# Patient Record
Sex: Female | Born: 1945 | Race: White | Hispanic: No
Health system: Midwestern US, Academic
[De-identification: ages and names within clinical notes are randomized; demographics above are authoritative.]

---

## 2018-03-05 ENCOUNTER — Ambulatory Visit: Admit: 2018-03-05 | Discharge: 2018-03-06 | Payer: MEDICARE

## 2018-03-05 ENCOUNTER — Encounter: Admit: 2018-03-05 | Discharge: 2018-03-05 | Payer: MEDICARE

## 2018-03-05 DIAGNOSIS — R42 Dizziness and giddiness: Principal | ICD-10-CM

## 2020-07-13 ENCOUNTER — Encounter: Admit: 2020-07-13 | Discharge: 2020-07-13 | Payer: MEDICARE

## 2020-07-14 ENCOUNTER — Encounter: Admit: 2020-07-14 | Discharge: 2020-07-14 | Payer: MEDICARE

## 2020-07-14 ENCOUNTER — Ambulatory Visit: Admit: 2020-07-14 | Discharge: 2020-07-14 | Payer: MEDICARE

## 2020-07-14 DIAGNOSIS — R9431 Abnormal electrocardiogram [ECG] [EKG]: Secondary | ICD-10-CM

## 2020-07-14 DIAGNOSIS — R918 Other nonspecific abnormal finding of lung field: Secondary | ICD-10-CM

## 2020-07-14 DIAGNOSIS — J441 Chronic obstructive pulmonary disease with (acute) exacerbation: Secondary | ICD-10-CM

## 2020-07-20 ENCOUNTER — Encounter: Admit: 2020-07-20 | Discharge: 2020-07-20 | Payer: MEDICARE

## 2020-07-21 ENCOUNTER — Encounter: Admit: 2020-07-21 | Discharge: 2020-07-21 | Payer: MEDICARE

## 2020-07-21 DIAGNOSIS — R9431 Abnormal electrocardiogram [ECG] [EKG]: Secondary | ICD-10-CM

## 2020-07-21 DIAGNOSIS — J449 Chronic obstructive pulmonary disease, unspecified: Secondary | ICD-10-CM

## 2020-07-21 DIAGNOSIS — E785 Hyperlipidemia, unspecified: Secondary | ICD-10-CM

## 2020-07-21 DIAGNOSIS — R030 Elevated blood-pressure reading, without diagnosis of hypertension: Secondary | ICD-10-CM

## 2020-07-21 DIAGNOSIS — R0600 Dyspnea, unspecified: Secondary | ICD-10-CM

## 2020-07-21 MED ORDER — ROSUVASTATIN 10 MG PO TAB
10 mg | ORAL_TABLET | Freq: Every day | ORAL | 1 refills | 90.00000 days | Status: AC
Start: 2020-07-21 — End: ?

## 2020-07-21 NOTE — Patient Instructions
Start Crestor 10mg  at bedtime  Stress tess   Recheck labs in 3 mo    [facility] Nuclear Stress Test Instructions    PLEASE REPORT TO:    ____[facility] [id number], [phone], [place], [place]) - [phone]   ____[facility] ([address], [address], [place], [place]) - [phone]    ____[facility] ([address] [place], [place]) - [phone]    ____[facility] ([address], [place], [place]) - [phone]   ____[facility] ([address], [place], [place] ) - [phone]     [facility] Main Phone Number: [phone]     Date of Test  ____________  at ____________  for ________________________    Are you able to raise your arm up by your head for about 20 minutes? yes  Can you lie on your back for approximately 20 minutes with minimal movement? yes     The Thallium evaluation has two parts -- two nuclear scans.   The first scan is done in the morning and the second three to four hours later.     Wear comfortable clothing. Bring or wear comfortable walking shoes.     It is recommended not to hold infants for 2 to 3 days after the test.   Please let the nuclear technologists know if you plan on flying after the test.     NO DECAF OR CAFFEINE 24 HOURS PRIOR TO TEST. Examples: coffee, tea, decaf coffee or tea, cola, chocolate.     DO NOT EAT OR DRINK THE MORNING OF YOUR TEST unless otherwise instructed. (You may have a couple sips of water.) If you are a diabetic, if insulin dependent: please take one third of your insulin with a light breakfast (two pieces of dry toast and a small juice). Bring insulin and medication with you to the test.     ___ TAKE MORNING MEDICATIONS WITH A COUPLE SIPS OF WATER PRIOR TO TEST.     Hold all vitamins and supplements on the morning of your test.     HOLD THE FOLLOWING MEDICATIONS AS INDICATED BELOW:           WHAT TO DO BETWEEN THE FIRST TWO THALLIUM SCANS:  1. No strenuous exercise should be performed during this time.  2. A light lunch is permissible. The technologist will give you a list of appropriate foods.  3. Please return 15 minutes prior to the schedule of your second scan. Our nuclear technologist will tell you exactly what time to return.  4. Please do not use tobacco products in between scans.  5. After the first scan is completed, you may resume usual medications.     TEST FINDINGS:  You will receive the results of the test within 7 business days of its completion by telephone, unless arranged differently at the time of the procedure.  If you have any questions concerning your thallium test or if you do not hear from your [facility] physician/or nurse within 7 business days, please call the appropriate office checked above.

## 2020-08-06 ENCOUNTER — Encounter: Admit: 2020-08-06 | Discharge: 2020-08-06 | Payer: MEDICARE

## 2020-08-06 ENCOUNTER — Ambulatory Visit: Admit: 2020-08-06 | Discharge: 2020-08-06 | Payer: MEDICARE

## 2020-08-06 DIAGNOSIS — R9431 Abnormal electrocardiogram [ECG] [EKG]: Secondary | ICD-10-CM

## 2020-08-06 DIAGNOSIS — R030 Elevated blood-pressure reading, without diagnosis of hypertension: Secondary | ICD-10-CM

## 2020-08-06 DIAGNOSIS — R0600 Dyspnea, unspecified: Secondary | ICD-10-CM

## 2020-08-13 ENCOUNTER — Encounter: Admit: 2020-08-13 | Discharge: 2020-08-13 | Payer: MEDICARE

## 2020-08-13 NOTE — Telephone Encounter
Called patient and discussed test results and recommendations. Patient is agreeable to follow up on 09/17/20 at 11:30 in [place].

## 2020-08-13 NOTE — Telephone Encounter
-----   Message from Altamease Oiler, MD sent at 08/13/2020  2:42 PM CDT -----  Pearletha Alfred just sent a message about getting a CTA on this patient to look at her coronary arteries.  I actually prefer to see her back in the office to discuss further testing so if you would mind setting up an appointment with me sometime in the next 6 to 8 weeks so we can talk I think that would be best.  Hold off on CTA for now.Thanks!

## 2020-09-18 ENCOUNTER — Encounter: Admit: 2020-09-18 | Discharge: 2020-09-18 | Payer: MEDICARE

## 2020-10-28 ENCOUNTER — Encounter: Admit: 2020-10-28 | Discharge: 2020-10-28 | Payer: MEDICARE

## 2021-01-05 ENCOUNTER — Encounter: Admit: 2021-01-05 | Discharge: 2021-01-05 | Payer: MEDICARE

## 2021-01-05 NOTE — Progress Notes
Contacted patient by phone on 01/05/2021 regarding lab order for Lipid Panel ordered per [facility] that has not been completed. Patient stated that she does not have transportation and is unable to get lab drawn before appointment date. Patient's daughter plans to bring patient to her appointment with Dr. Sandria Manly in [place], [place] on 01/12/2021. Patient will pick up lab order at appointment and have drawn in the [facility] same day.

## 2021-01-12 ENCOUNTER — Encounter: Admit: 2021-01-12 | Discharge: 2021-01-12 | Payer: MEDICARE

## 2021-01-12 DIAGNOSIS — E782 Mixed hyperlipidemia: Secondary | ICD-10-CM

## 2021-01-12 DIAGNOSIS — R030 Elevated blood-pressure reading, without diagnosis of hypertension: Secondary | ICD-10-CM

## 2021-01-12 DIAGNOSIS — R9431 Abnormal electrocardiogram [ECG] [EKG]: Secondary | ICD-10-CM

## 2021-01-12 DIAGNOSIS — J449 Chronic obstructive pulmonary disease, unspecified: Secondary | ICD-10-CM

## 2021-01-12 MED ORDER — ROSUVASTATIN 10 MG PO TAB
10 mg | ORAL_TABLET | Freq: Every day | ORAL | 3 refills | 90.00000 days | Status: AC
Start: 2021-01-12 — End: ?

## 2021-01-12 NOTE — Patient Instructions
Start crestor.  Recheck labs in 3 months  Follow-up in 6 months    Follow up as directed.  Call sooner if issues.  Call the [place] nursing line at [phone].  Leave a detailed message for the nurse in [place] [facility] with how we can assist you and we will call you back.

## 2021-07-02 ENCOUNTER — Encounter: Admit: 2021-07-02 | Discharge: 2021-07-02 | Payer: MEDICARE

## 2021-07-20 ENCOUNTER — Encounter: Admit: 2021-07-20 | Discharge: 2021-07-20 | Payer: MEDICARE

## 2021-07-20 DIAGNOSIS — E782 Mixed hyperlipidemia: Secondary | ICD-10-CM

## 2021-07-20 DIAGNOSIS — M79606 Pain in leg, unspecified: Secondary | ICD-10-CM

## 2021-07-20 DIAGNOSIS — R9431 Abnormal electrocardiogram [ECG] [EKG]: Secondary | ICD-10-CM

## 2021-07-20 DIAGNOSIS — R600 Localized edema: Secondary | ICD-10-CM

## 2021-07-20 DIAGNOSIS — Z136 Encounter for screening for cardiovascular disorders: Secondary | ICD-10-CM

## 2021-07-20 DIAGNOSIS — I739 Peripheral vascular disease, unspecified: Secondary | ICD-10-CM

## 2021-07-20 DIAGNOSIS — J449 Chronic obstructive pulmonary disease, unspecified: Secondary | ICD-10-CM

## 2021-07-20 DIAGNOSIS — J439 Emphysema, unspecified: Secondary | ICD-10-CM

## 2021-07-20 DIAGNOSIS — R0989 Other specified symptoms and signs involving the circulatory and respiratory systems: Secondary | ICD-10-CM

## 2021-07-20 NOTE — Patient Instructions
Echo, Carotids, Aorta, lower ext ultrasound    Labs; FLP    Follow up in 6 mo    Follow up as directed.  Call sooner if issues.  Call the [place] nursing line at [phone].  Leave a detailed message for the nurse in [place] [facility] with how we can assist you and we will call you back.

## 2021-07-29 ENCOUNTER — Encounter: Admit: 2021-07-29 | Discharge: 2021-07-29 | Payer: MEDICARE

## 2021-07-29 DIAGNOSIS — R0989 Other specified symptoms and signs involving the circulatory and respiratory systems: Secondary | ICD-10-CM

## 2021-07-29 DIAGNOSIS — Z136 Encounter for screening for cardiovascular disorders: Secondary | ICD-10-CM

## 2021-07-29 DIAGNOSIS — R9431 Abnormal electrocardiogram [ECG] [EKG]: Secondary | ICD-10-CM

## 2021-07-29 DIAGNOSIS — I739 Peripheral vascular disease, unspecified: Secondary | ICD-10-CM

## 2021-07-29 DIAGNOSIS — J439 Emphysema, unspecified: Secondary | ICD-10-CM

## 2021-07-29 DIAGNOSIS — E782 Mixed hyperlipidemia: Secondary | ICD-10-CM

## 2021-07-29 LAB — LIPID PROFILE
CHOLESTEROL/HDL %: 3
CHOLESTEROL: 149
HDL: 45
LDL: 82
TRIGLYCERIDES: 112
VLDL: 22

## 2021-08-04 ENCOUNTER — Encounter: Admit: 2021-08-04 | Discharge: 2021-08-04 | Payer: MEDICARE

## 2021-08-04 DIAGNOSIS — E782 Mixed hyperlipidemia: Secondary | ICD-10-CM

## 2021-08-04 MED ORDER — ROSUVASTATIN 20 MG PO TAB
20 mg | ORAL_TABLET | Freq: Every day | ORAL | 3 refills | 90.00000 days | Status: AC
Start: 2021-08-04 — End: ?

## 2021-08-04 NOTE — Telephone Encounter
Called and discussed results and recommendations with patient. Patient is agreeable to care plan and has no further questions at this time.

## 2021-08-04 NOTE — Telephone Encounter
-----   Message from Altamease Oiler, MD sent at 08/04/2021  2:05 PM CDT -----  Will she let us increase to Crestor 20 daily?  ----- Message -----  From: Florene Route, RN  Sent: 07/29/2021   1:23 PM CDT  To: Altamease Oiler, MD    Labs for your review and recommendations. Patient is on Crestor 10mg  daily. Thank you!

## 2021-10-31 ENCOUNTER — Encounter: Admit: 2021-10-31 | Discharge: 2021-10-31 | Payer: MEDICARE

## 2021-11-01 NOTE — Progress Notes
63 female, in ED 50 minutes, generally not feeling well.  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    BP 180/80, HR 90  No chest pain  HTN not hypotensive    Per Dr Hajj:  Suggest control BP  Check troponin  Check labs  Repeat EKG for dynamic changes and feel free to reach back out with concerns  ECHO would be helpful if capbilities.  Referring states they do not ECHO.    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 QRS 129, slightly larger QRS (than 9/6 EKG) so based on EKG would not call STEMI/AMI    9/6 EKG-->sent to Dr Hajj.  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    Pt will remain at sending for further workup.

## 2022-05-13 IMAGING — CT PE(Adult)
3 of 5 series · 15 of 36 positions shown · non-contrast
Comparison: none

[Series 4: cta pulmonary ax 2.00 bv36 s3 · axial · 0.56mm/px · z∈[+1546,+1708]mm · 5 of 126 slices shown]
[im 18/126  lung]
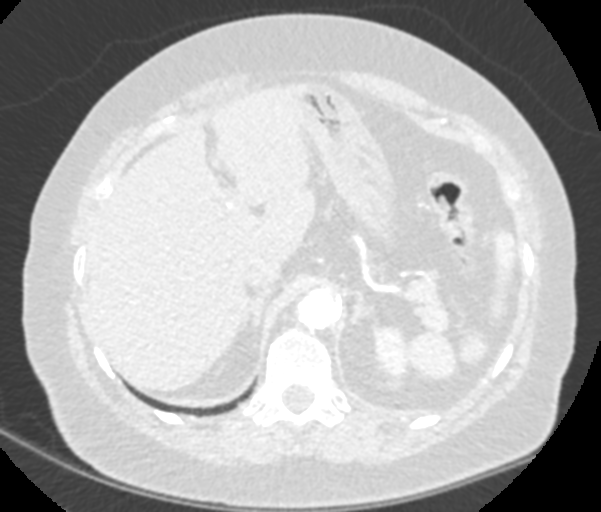
[im 36/126  lung]
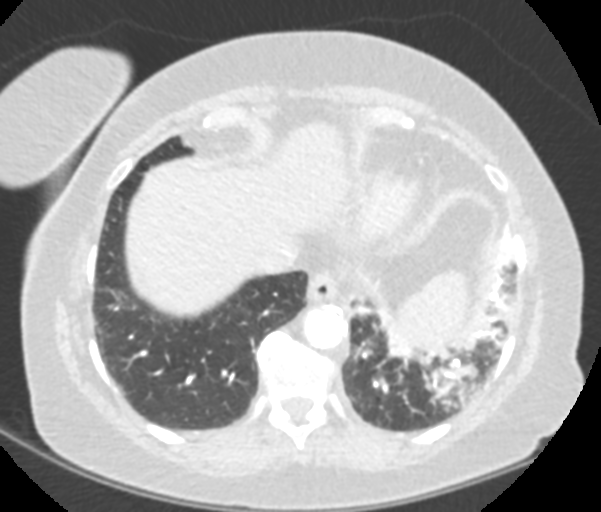
[im 54/126  lung]
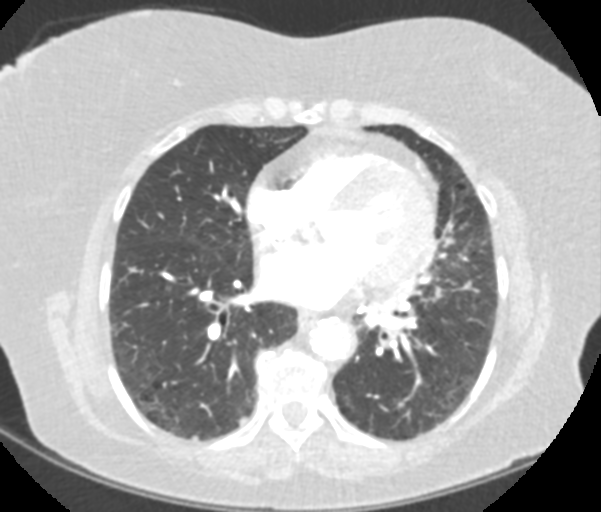
[im 72/126  lung]
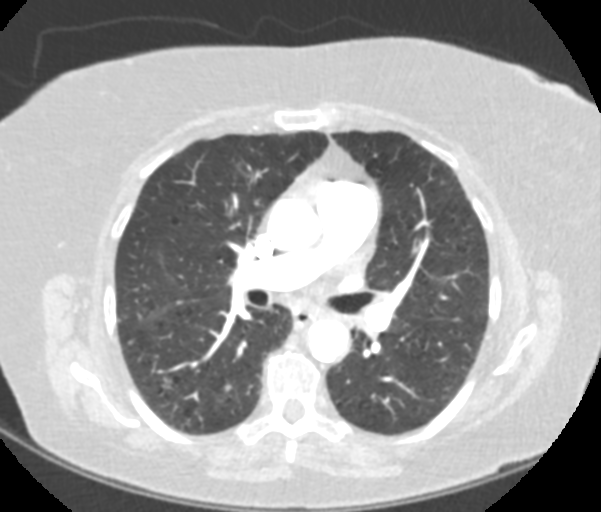
[im 90/126  lung]
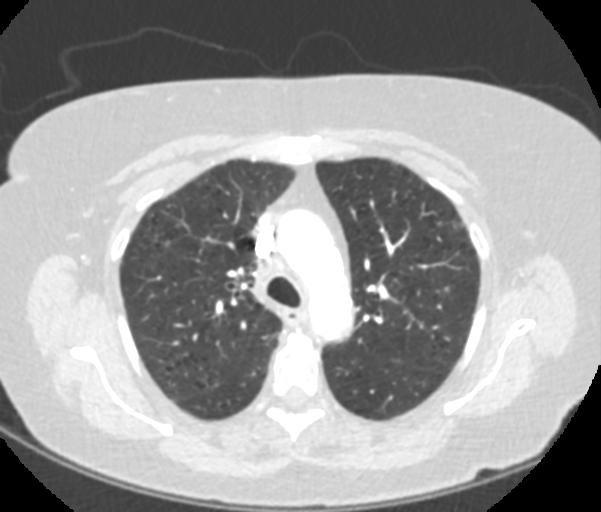

[Series 6: cta pulmonary cor 2.00 bv36 s3 · coronal · 0.57mm/px · 3 of 137 slices shown]
[im 28/137  mediastinal]
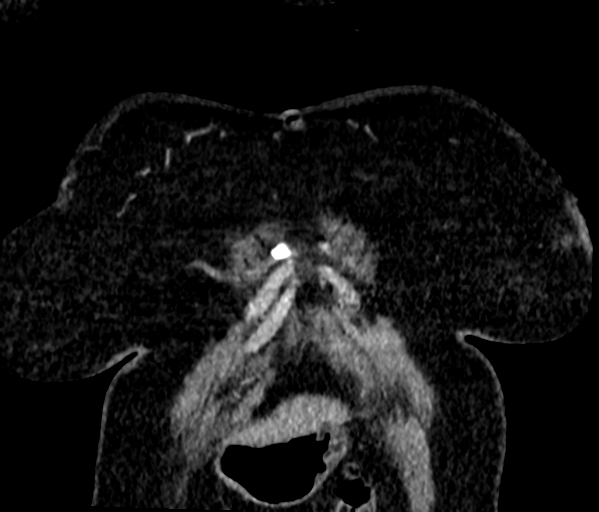
[im 55/137  mediastinal]
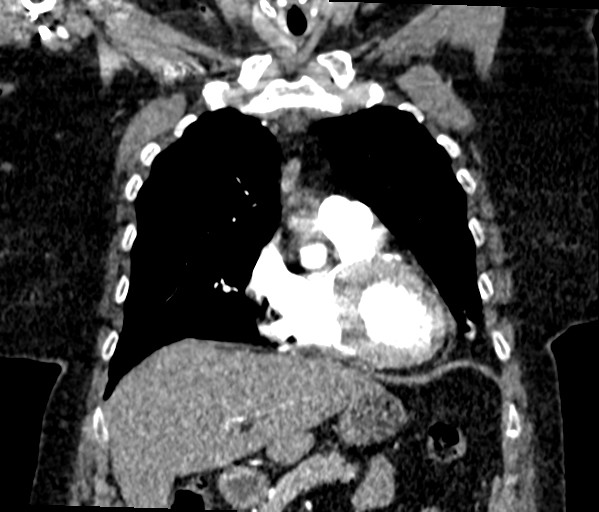
[im 82/137  mediastinal]
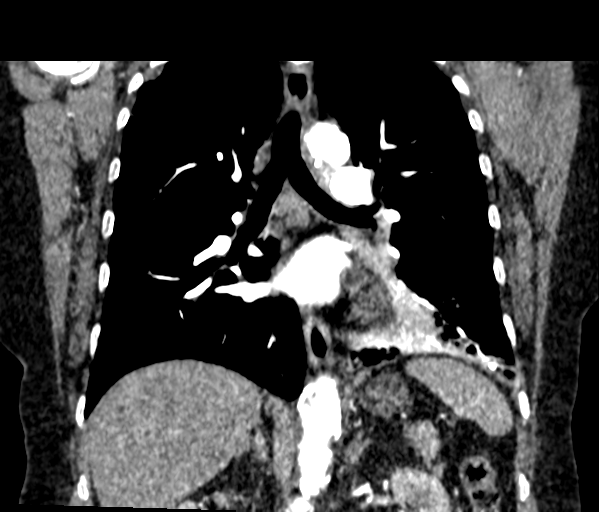

[Series 10: cta pulmonary ax 10.00 bv36 s3 · axial · 0.56mm/px · z∈[+1542,+1752]mm · 7 of 133 slices shown]
[im 17/133  lung]
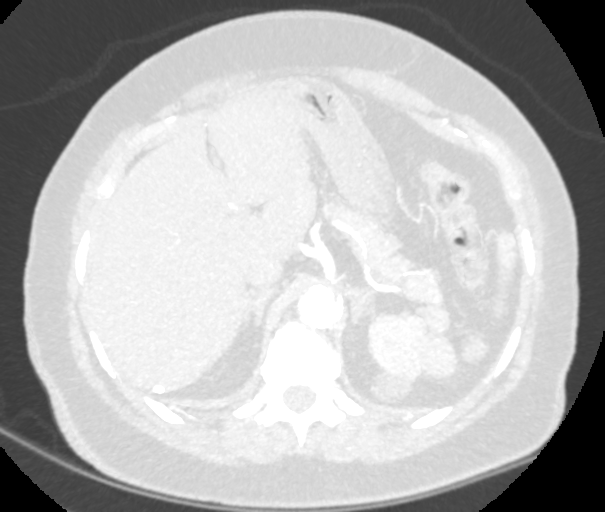
[im 34/133  mediastinal]
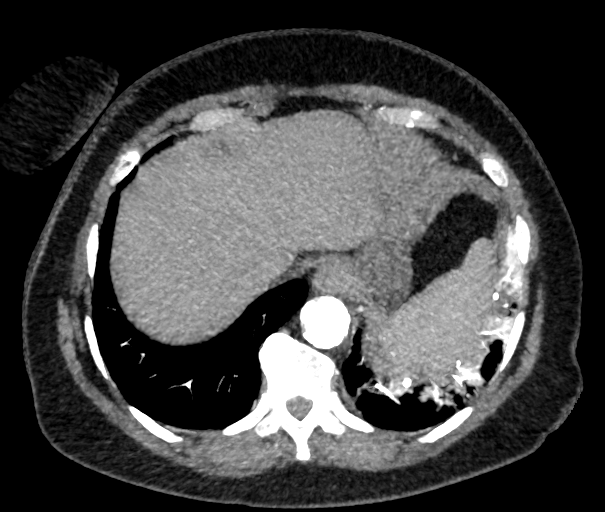
[im 50/133  lung]
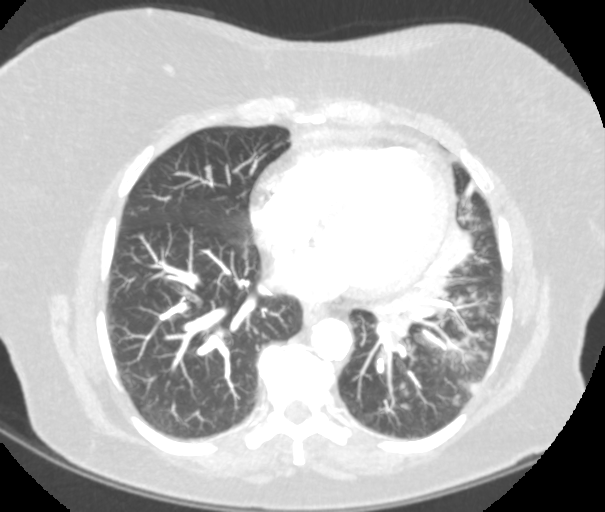
[im 67/133  mediastinal]
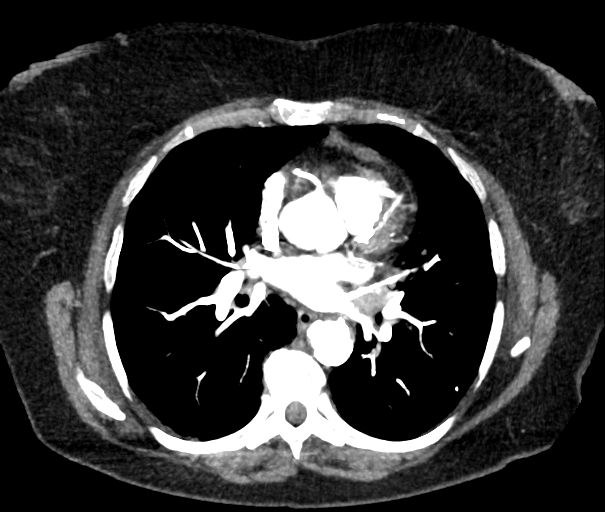
[im 83/133  lung]
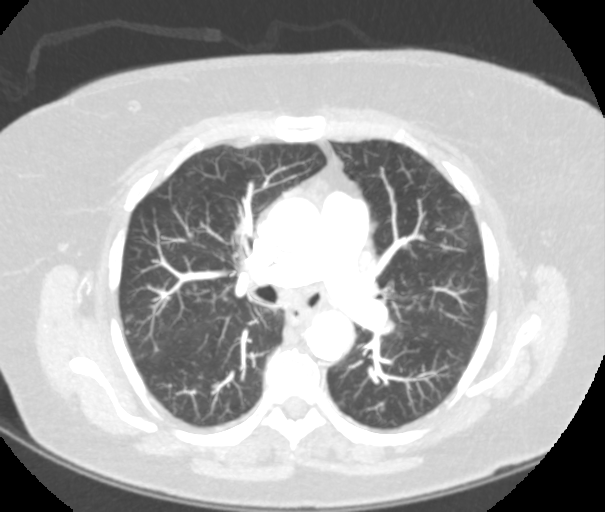
[im 100/133  mediastinal]
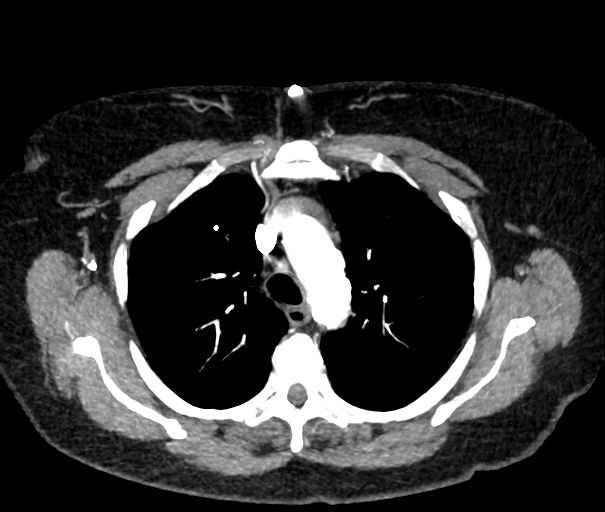
[im 116/133  lung]
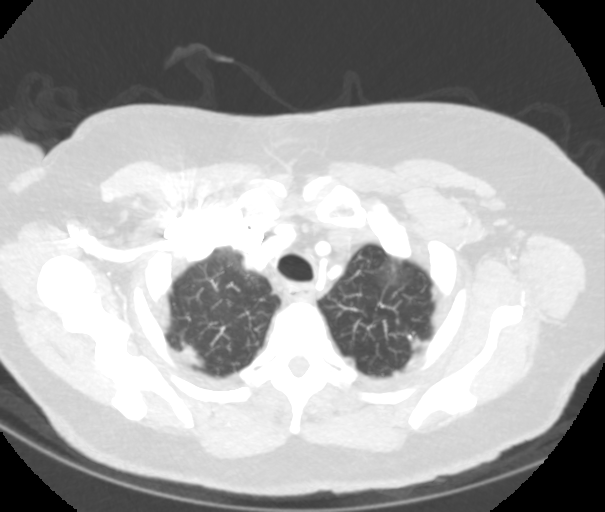

[15 of 36 positions shown; findings below may reference images not displayed]

EXAM

CTA of the chest was performed with IV contrast

INDICATION

SOA, elevated D dimer
PT C/O FEVER, FATIGUE, SOA.  HX. LUNG BX 10/11/21 FOR PULMONARY NODULE.  HX. COPD,PAD.  CREAT =
0.89.  GFR = 65.5.  100 ML ONMIPAQUE 350.  AB/HB  CT/NM: 1/0.

TECHNIQUE

All CT scans at this facility use dose modulation, iterative reconstruction, and/or weight based
dosing when appropriate to reduce radiation dose to as low as reasonably achievable.

# of CT scans in the past year: 3

# of Myocardial perfusion scans this past year: 0

COMPARISONS

October 28, 2021

FINDINGS

There is a small pericardial effusion measuring up to 5 millimeters. There are aortic vascular
calcifications. There are no filling defects support acute pulmonary embolus. There is bilateral
apical pleural thickening which is unchanged. There are emphysematous changes in both lungs. There
peripheral ground-glass opacities and increased interstitial markings which are unchanged. There are
peribronchial infiltrates and consolidations in the lingula segment of the left upper lobe. There is
consolidations in peribronchial infiltrates in the left lower lobe. This is new from the prior
examination there are 2 nodules identified in the right lower lobe measuring up to 4.5 millimeters
which are unchanged.

The irregular nodule in the right lower lobe measures 1 centimeter and is slightly decreased in
size from the prior exam. There are nodules in the posterior right lower lobe measuring up to 7
millimeters which are unchanged. There is no significant axillary adenopathy. No significant
mediastinal or hilar adenopathy is identified. There is a small partially calcified right hilar node
measuring up to 1.2 centimeters which is unchanged. There is bilateral peribronchial thickening. The
visualized portions of the upper abdomen are unremarkable.

IMPRESSION
1. No evidence of acute pulmonary embolus.
2. Peribronchial infiltrates and consolidations in the left upper and lower lobes consistent with
bronchopulmonary pneumonia. Follow-up chest x-rays are recommended to ensure resolution.
3. Small pericardial effusion measuring 5 millimeters.
4. Bilateral apical and upper lobe pleural thickening, unchanged.
5. Emphysematous changes.
6. Peripheral ground-glass opacities and increased interstitial markings bilaterally, unchanged.
7. Scattered pulmonary nodules measuring up to 7 millimeters, unchanged. A 3 month follow-up CT of
the chest is recommended.
8. The nodule in the posterior right lower lobe is slightly improved measuring 1 centimeter.
9. No significant chest adenopathy.

Tech Notes:

PT C/O FEVER, FATIGUE, SOA.  HX. LUNG BX 10/11/21 FOR PULMONARY NODULE.  HX. COPD,PAD.  CREAT = 0.89.
GFR = 65.5.  100 ML ONMIPAQUE 350.  AB/HB
CT/NM: 1/0.

## 2022-08-02 IMAGING — CR HIPCMLT
2 series · 2 of 2 positions shown · non-contrast
Comparison: none

[hip ap]
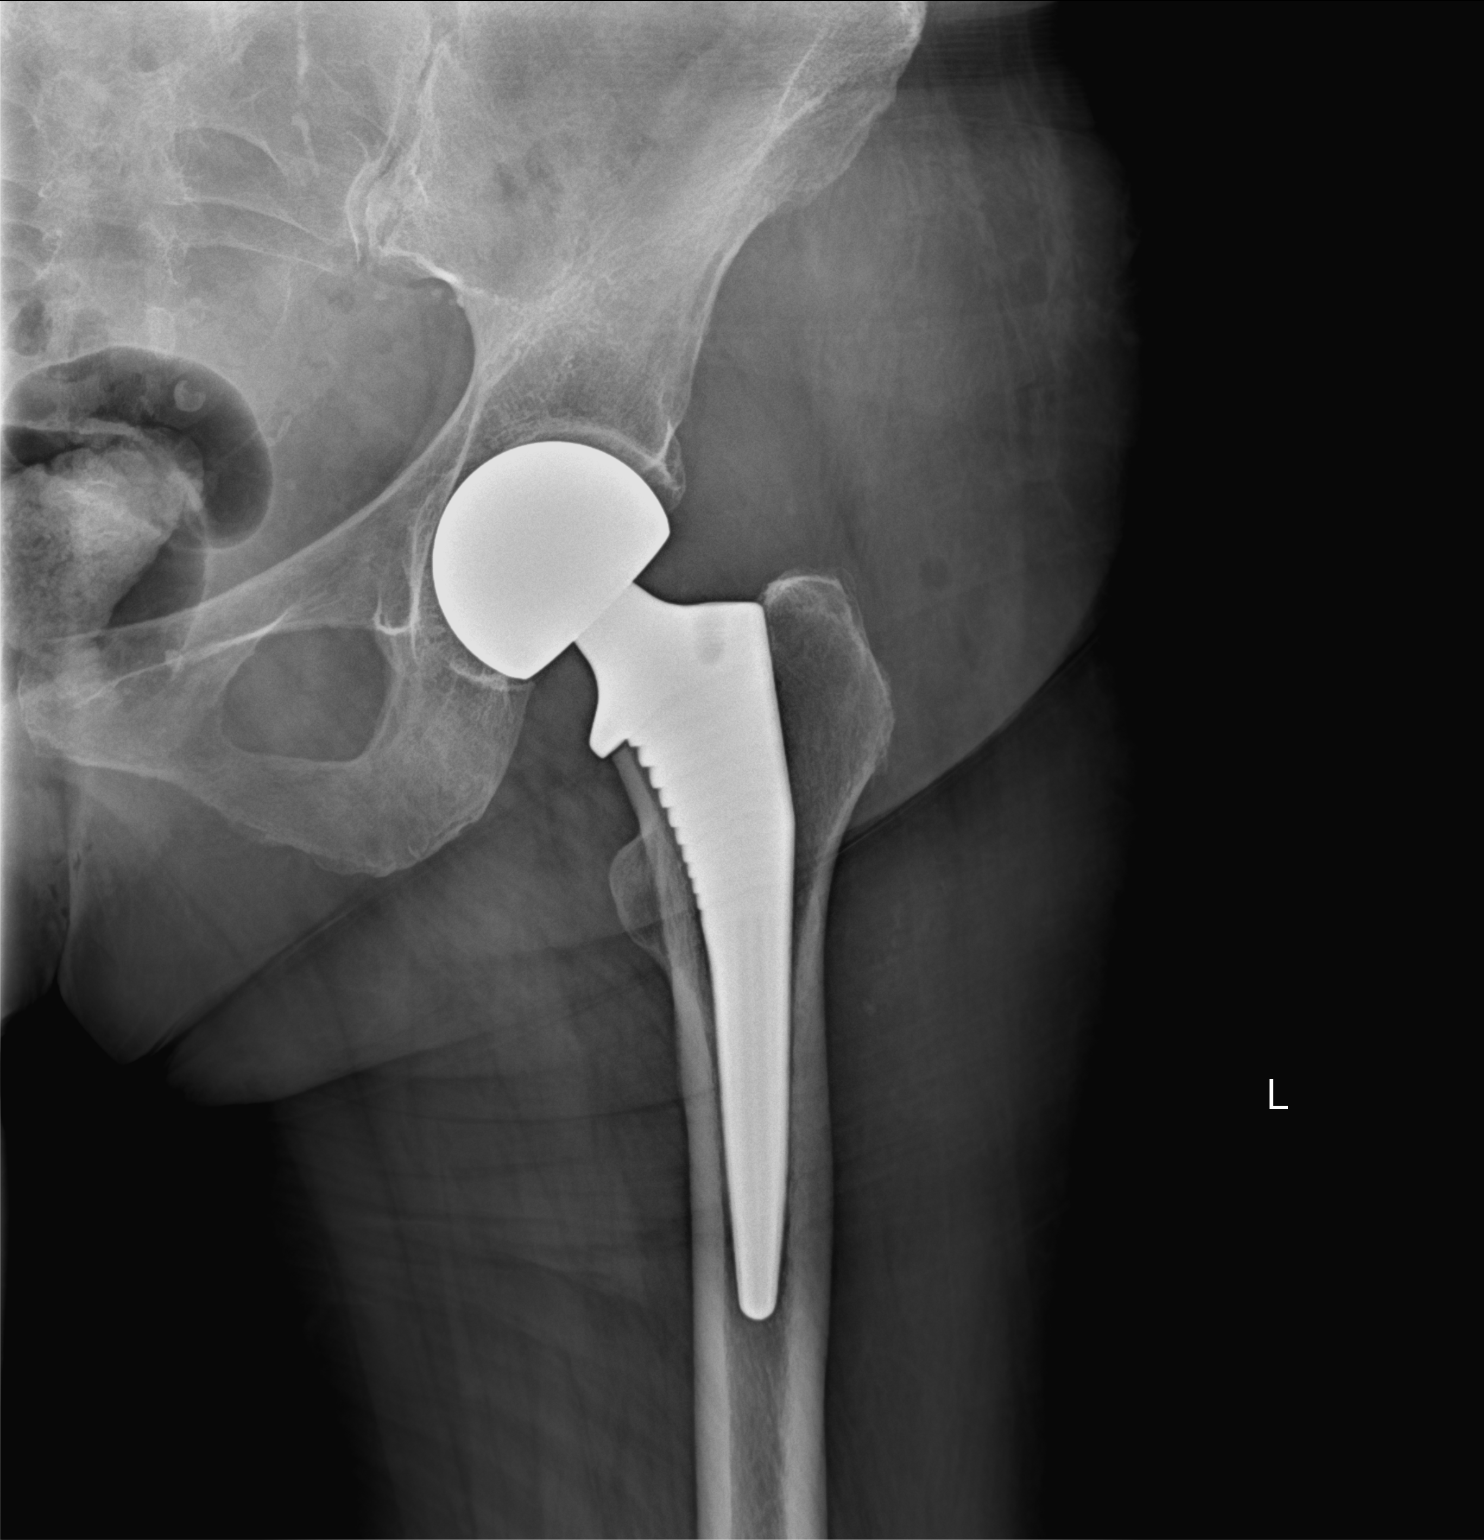

[hip frog lat]
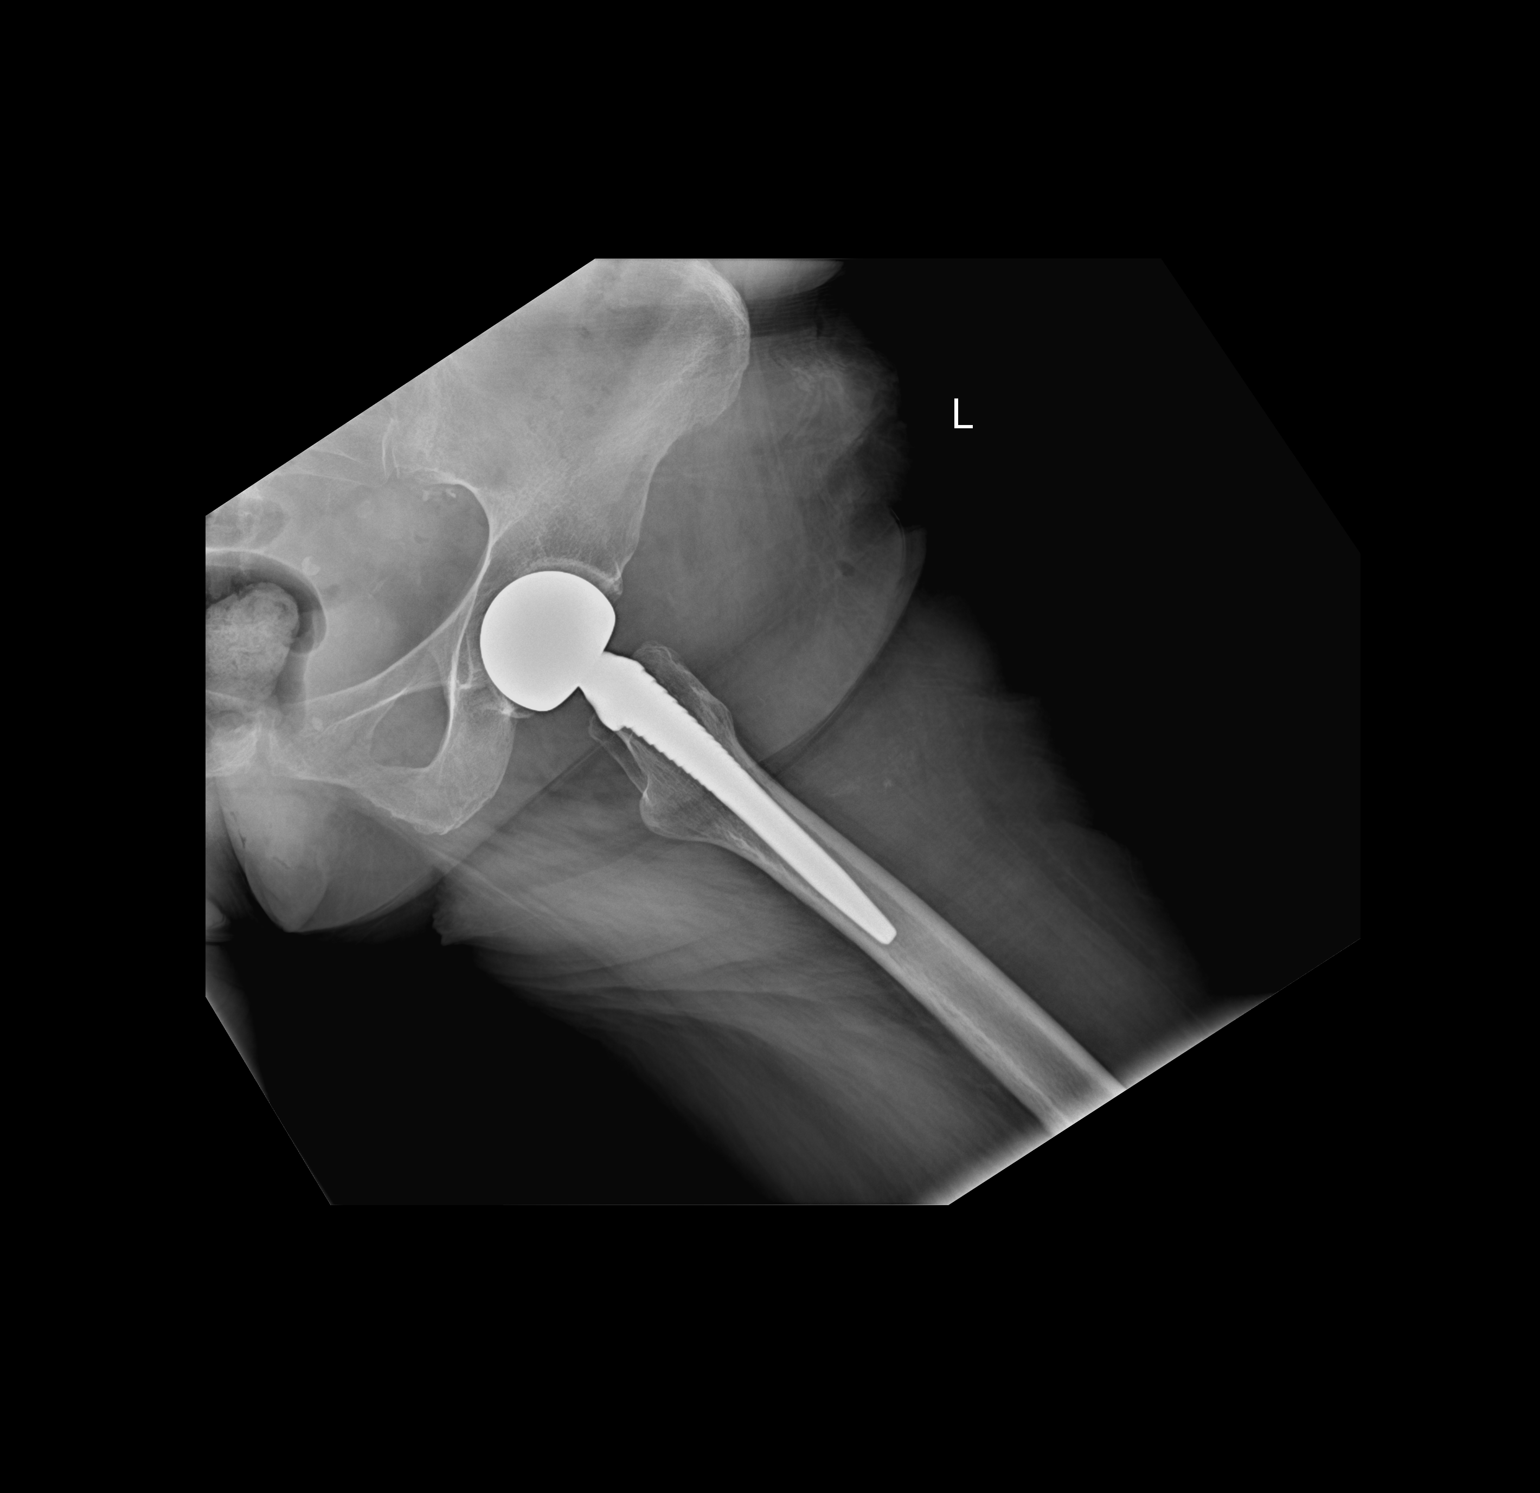

[2 of 2 positions shown; findings below may reference images not displayed]

EXAM

XR hip LT, 2-3V w or wo pelvis

INDICATION

Follow-up left hip replacement

TECHNIQUE

Three views of the left hip

COMPARISONS

01/06/2022

FINDINGS

Left hip arthroplasty. No osseous lucency along the hardware bone interfaces. No perihardware
fracture or dislocation.  There is diffusely decreased bone mineralization. Likely right gluteal
calcifications.

IMPRESSION
1. Left hip arthroplasty without radiographic evidence of hardware complication.

Tech Notes:

FU LEFT HIP REPLACEMENT/FX. RG

## 2022-09-05 ENCOUNTER — Encounter: Admit: 2022-09-05 | Discharge: 2022-09-05 | Payer: MEDICARE

## 2022-09-05 DIAGNOSIS — G459 Transient cerebral ischemic attack, unspecified: Secondary | ICD-10-CM

## 2022-09-05 DIAGNOSIS — R030 Elevated blood-pressure reading, without diagnosis of hypertension: Secondary | ICD-10-CM
